# Patient Record
Sex: Female | Born: 2000 | Race: White | Hispanic: No | Marital: Single
Health system: Southern US, Community
[De-identification: ages and names within clinical notes are randomized; demographics above are authoritative.]

## PROBLEM LIST (undated history)

## (undated) DIAGNOSIS — G4700 Insomnia, unspecified: Secondary | ICD-10-CM

## (undated) DIAGNOSIS — F32A Depression, unspecified: Secondary | ICD-10-CM

## (undated) DIAGNOSIS — F329 Major depressive disorder, single episode, unspecified: Secondary | ICD-10-CM

## (undated) DIAGNOSIS — F988 Other specified behavioral and emotional disorders with onset usually occurring in childhood and adolescence: Secondary | ICD-10-CM

## (undated) HISTORY — PX: OTHER SURGICAL HISTORY: SHX169

## (undated) HISTORY — PX: URETERAL STENT PLACEMENT: SHX822

---
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
## 2023-01-26 LAB — PANORAMA PRENATAL TEST FULL PANEL:PANORAMA TEST PLUS 5 ADDITIONAL MICRODELETIONS: FETAL FRACTION: 4.7
